# Patient Record
Sex: Female | Born: 1990 | Hispanic: Yes | Marital: Married | Smoking: Never smoker
Health system: Southern US, Community
[De-identification: ages and names within clinical notes are randomized; demographics above are authoritative.]

## PROBLEM LIST (undated history)

## (undated) DIAGNOSIS — Z789 Other specified health status: Secondary | ICD-10-CM

## (undated) HISTORY — PX: NO PAST SURGERIES: SHX2092

---

## 2021-11-27 ENCOUNTER — Other Ambulatory Visit: Payer: Self-pay

## 2021-11-27 ENCOUNTER — Encounter (HOSPITAL_COMMUNITY): Payer: Self-pay | Admitting: Emergency Medicine

## 2021-11-27 ENCOUNTER — Emergency Department (HOSPITAL_COMMUNITY): Payer: Self-pay

## 2021-11-27 ENCOUNTER — Emergency Department (HOSPITAL_COMMUNITY)
Admission: EM | Admit: 2021-11-27 | Discharge: 2021-11-27 | Disposition: A | Discharge status: Home / Self Care | Payer: Self-pay | Attending: Emergency Medicine | Admitting: Emergency Medicine

## 2021-11-27 DIAGNOSIS — N9489 Other specified conditions associated with female genital organs and menstrual cycle: Secondary | ICD-10-CM | POA: Insufficient documentation

## 2021-11-27 DIAGNOSIS — R109 Unspecified abdominal pain: Secondary | ICD-10-CM | POA: Insufficient documentation

## 2021-11-27 DIAGNOSIS — N3000 Acute cystitis without hematuria: Secondary | ICD-10-CM

## 2021-11-27 DIAGNOSIS — R350 Frequency of micturition: Secondary | ICD-10-CM | POA: Insufficient documentation

## 2021-11-27 DIAGNOSIS — R300 Dysuria: Secondary | ICD-10-CM | POA: Insufficient documentation

## 2021-11-27 LAB — CBC WITH DIFFERENTIAL/PLATELET
Abs Immature Granulocytes: 0.02 10*3/uL (ref 0.00–0.07)
Basophils Absolute: 0.1 10*3/uL (ref 0.0–0.1)
Basophils Relative: 1 %
Eosinophils Absolute: 0.1 10*3/uL (ref 0.0–0.5)
Eosinophils Relative: 2 %
HCT: 42 % (ref 36.0–46.0)
Hemoglobin: 14.1 g/dL (ref 12.0–15.0)
Immature Granulocytes: 0 %
Lymphocytes Relative: 40 %
Lymphs Abs: 3.4 10*3/uL (ref 0.7–4.0)
MCH: 29.1 pg (ref 26.0–34.0)
MCHC: 33.6 g/dL (ref 30.0–36.0)
MCV: 86.8 fL (ref 80.0–100.0)
Monocytes Absolute: 0.6 10*3/uL (ref 0.1–1.0)
Monocytes Relative: 7 %
Neutro Abs: 4.3 10*3/uL (ref 1.7–7.7)
Neutrophils Relative %: 50 %
Platelets: 366 10*3/uL (ref 150–400)
RBC: 4.84 MIL/uL (ref 3.87–5.11)
RDW: 12.5 % (ref 11.5–15.5)
WBC: 8.5 10*3/uL (ref 4.0–10.5)
nRBC: 0 % (ref 0.0–0.2)

## 2021-11-27 LAB — URINALYSIS, ROUTINE W REFLEX MICROSCOPIC
Bilirubin Urine: NEGATIVE
Glucose, UA: NEGATIVE mg/dL
Hgb urine dipstick: NEGATIVE
Ketones, ur: NEGATIVE mg/dL
Leukocytes,Ua: NEGATIVE
Nitrite: NEGATIVE
Protein, ur: NEGATIVE mg/dL
Specific Gravity, Urine: 1.018 (ref 1.005–1.030)
pH: 8 (ref 5.0–8.0)

## 2021-11-27 LAB — I-STAT BETA HCG BLOOD, ED (MC, WL, AP ONLY): I-stat hCG, quantitative: <5 m[IU]/mL (ref <5)

## 2021-11-27 MED ORDER — IBUPROFEN 800 MG PO TABS
800.0000 mg | ORAL_TABLET | Freq: Once | ORAL | Status: AC
Start: 2021-11-27 — End: 2021-11-27
  Administered 2021-11-27: 800 mg via ORAL
  Filled 2021-11-27: qty 1

## 2021-11-27 MED ORDER — CEPHALEXIN 250 MG PO CAPS
500.0000 mg | ORAL_CAPSULE | Freq: Once | ORAL | Status: AC
  Administered 2021-11-27: 500 mg via ORAL
  Filled 2021-11-27: qty 2

## 2021-11-27 MED ORDER — CEPHALEXIN 500 MG PO CAPS: 500.0000 mg | ORAL_CAPSULE | Freq: Three times a day (TID) | ORAL | 0 refills | Status: DC

## 2021-11-27 NOTE — ED Provider Triage Note (Signed)
Emergency Medicine Provider Triage Evaluation Note ? ?[place] , a 31 y.o. female  was evaluated in triage.  Pt complains of dysuria, urinary frequency, and back pain x 2 months. Family at bedside interpreting - states that she was seen at a clinic 2 months ago and urine looked somewhat suspicious for a UTI - she was started on abx and her pain somewhat subsided after 1 week on the medication however has never gone away. Pain sometimes radiates to her RLQ however she describes the pain in the "center" of her back.  ? ?Review of Systems  ?Positive: + flank pain, abd pain, dysuria, urinary frequency ?Negative: - nausea, vomiting, fevers ? ?Physical Exam  ?BP 121/61 (BP Location: Left Arm)   Pulse (!) 58   Temp 98.4 ?F (36.9 ?C) (Oral)   Resp 17   LMP 11/23/2021   SpO2 99%  ?Gen:   Awake, no distress   ?Resp:  Normal effort  ?MSK:   Moves extremities without difficulty  ?Other:   ? ?Medical Decision Making  ?Medically screening exam initiated at 12:51 PM.  Appropriate orders placed.  [place] Throckmorton was informed that the remainder of the evaluation will be completed by another provider, this initial triage assessment does not replace that evaluation, and the importance of remaining in the ED until their evaluation is complete. ? ? ?  ?Tanda Rockers, PA-C ?11/27/21 1252 ? ?

## 2021-11-27 NOTE — ED Provider Notes (Signed)
?[facility] EMERGENCY DEPARTMENT ?Provider Note ? ? ?CSN: [id number] ?Arrival date & time: 11/27/21  1115 ? ?  ? ?History ? ?Chief Complaint  ?Patient presents with  ? Back Pain  ? Dysuria  ? ? ?Robin Shelton is a 31 y.o. female here presenting with bilateral flank pain and dysuria.  Patient has been having ongoing symptoms for 2 months.  She states that she finished a course antibiotics and briefly felt better.  She states that for the last 3 to 4 days, she has worsening flank pain.  She states that she has worsening dysuria and frequency as well.  Denies any fevers or chills or vomiting.  Denies any trauma or injury ? ?The history is provided by the patient.  ? ?  ? ?Home Medications ?Prior to Admission medications   ?Not on File  ?   ? ?Allergies    ?Patient has no allergy information on record.   ? ?Review of Systems   ?Review of Systems  ?Genitourinary:  Positive for dysuria.  ?Musculoskeletal:  Positive for back pain.  ?All other systems reviewed and are negative. ? ?Physical Exam ?Updated Vital Signs ?BP 128/69 (BP Location: Left Arm)   Pulse 79   Temp 98.4 ?F (36.9 ?C) (Oral)   Resp 19   LMP 11/23/2021   SpO2 100%  ?Physical Exam ?Vitals and nursing note reviewed.  ?Constitutional:   ?   Appearance: Normal appearance.  ?HENT:  ?   Head: Normocephalic.  ?   Nose: Nose normal.  ?   Mouth/Throat:  ?   Mouth: Mucous membranes are moist.  ?Eyes:  ?   Pupils: Pupils are equal, round, and reactive to light.  ?Cardiovascular:  ?   Rate and Rhythm: Normal rate and regular rhythm.  ?   Pulses: Normal pulses.  ?   Heart sounds: Normal heart sounds.  ?Pulmonary:  ?   Effort: Pulmonary effort is normal.  ?   Breath sounds: Normal breath sounds.  ?Abdominal:  ?   Comments: Mild bilateral CVA tenderness versus paralumbar tenderness.  No abdominal tenderness  ?Musculoskeletal:  ?   Cervical back: Normal range of motion and neck supple.  ?Skin: ?   General: Skin is warm.  ?   Capillary Refill: Capillary  refill takes less than 2 seconds.  ?Neurological:  ?   General: No focal deficit present.  ?   Mental Status: She is alert and oriented to person, place, and time.  ?Psychiatric:     ?   Mood and Affect: Mood normal.     ?   Behavior: Behavior normal.  ? ? ?ED Results / Procedures / Treatments   ?Labs ?(all labs ordered are listed, but only abnormal results are displayed) ?Labs Reviewed  ?URINALYSIS, ROUTINE W REFLEX MICROSCOPIC - Abnormal; Notable for the following components:  ?    Result Value  ? APPearance TURBID (*)   ? Bacteria, UA RARE (*)   ? All other components within normal limits  ?CBC WITH DIFFERENTIAL/PLATELET  ?BASIC METABOLIC PANEL  ?I-STAT BETA HCG BLOOD, ED ([facility], [facility], [facility] ONLY)  ? ? ?EKG ?None ? ?Radiology ?CT Renal Stone Study ? ?Result Date: 11/27/2021 ?CLINICAL DATA:  Back pain, dysuria, urinary frequency EXAM: CT ABDOMEN AND PELVIS WITHOUT CONTRAST TECHNIQUE: Multidetector CT imaging of the abdomen and pelvis was performed following the standard protocol without IV contrast. RADIATION DOSE REDUCTION: This exam was performed according to the departmental dose-optimization program which includes automated exposure control, adjustment of  the mA and/or kV according to patient size and/or use of iterative reconstruction technique. COMPARISON:  None. FINDINGS: Lower chest: No acute abnormality. Hepatobiliary: No focal liver abnormality is seen. No gallstones, gallbladder wall thickening, or biliary dilatation. Pancreas: Unremarkable. No pancreatic ductal dilatation or surrounding inflammatory changes. Spleen: Normal in size without focal abnormality. Adrenals/Urinary Tract: The adrenal glands are unremarkable. Normal noncontrast appearance of the bilateral kidneys, without hydronephrosis or nephrolithiasis. No stone is seen in the course of the ureters, although the inferior ureters are poorly evaluated. No stone is seen in the bladder, which is unremarkable for degree of distension. Stomach/Bowel:  Stomach is within normal limits. Appendix appears normal. No evidence of bowel wall thickening, distention, or inflammatory changes. Vascular/Lymphatic: No significant vascular findings are present. No enlarged abdominal or pelvic lymph nodes. Reproductive: Uterus and bilateral adnexa are unremarkable. Other: No abdominal wall hernia or abnormality. No abdominopelvic ascites. Musculoskeletal: No acute or significant osseous findings. IMPRESSION: 1. Normal noncontrast appearance of the bilateral kidneys, without radiopaque calculus seen in the kidneys, ureters, or bladder. 2. No acute process in the abdomen or pelvis. Electronically Signed   By: Wiliam Ke M.D.   On: 11/27/2021 14:50   ? ?Procedures ?Procedures  ? ? ?Medications Ordered in ED ?Medications  ?cephALEXin (KEFLEX) capsule 500 mg (500 mg Oral Given 11/27/21 2218)  ?ibuprofen (ADVIL) tablet 800 mg (800 mg Oral Given 11/27/21 2218)  ? ? ?ED Course/ Medical Decision Making/ A&P ?  ?                        ?Medical Decision Making ?Robin Shelton is a 32 y.o. female here with dysuria and bilateral flank pain.  Consider pyelonephritis versus renal colic versus musculoskeletal back pain.  We will get CBC and CMP and UA and CT abdomen pelvis ? ?10:33 PM ?Lab work unremarkable.  UA showed some bacteria.  CT showed no obvious pyelonephritis.  I think she likely has UTI.  Since she is symptomatic we will give a course of antibiotic ?  ? ?Problems Addressed: ?Acute cystitis without hematuria: acute illness or injury ? ?Amount and/or Complexity of Data Reviewed ?Labs: ordered. Decision-making details documented in ED Course. ?Radiology: ordered and independent interpretation performed. Decision-making details documented in ED Course. ? ?Risk ?Prescription drug management. ? ? ?ision making why or why not admission, treatments were needed:1} ?Final Clinical Impression(s) / ED Diagnoses ?Final diagnoses:  ?None  ? ? ?Rx / DC Orders ?ED Discharge Orders   ? ? None  ? ?   ? ? ?  ?Charlynne Pander, MD ?11/27/21 2234 ? ?

## 2021-11-27 NOTE — Discharge Instructions (Signed)
You likely have a urinary tract infection ? ?Take Keflex 3 times daily for a week ? ?Take Motrin 600 mg every 6 hours for pain ? ?See your doctor for follow-up ? ?Return to ER if you have worse abdominal pain, flank pain, trouble urinating, fever ?

## 2021-11-27 NOTE — ED Triage Notes (Signed)
Pt reports lower back pain, cramping, dysuria, and foul smelling urine x 2 months. ?

## 2022-05-03 LAB — OB RESULTS CONSOLE RPR
RPR: NONREACTIVE
RPR: NONREACTIVE

## 2022-05-03 LAB — OB RESULTS CONSOLE GC/CHLAMYDIA
Chlamydia: NEGATIVE
Chlamydia: NEGATIVE
Neisseria Gonorrhea: NEGATIVE
Neisseria Gonorrhea: NEGATIVE

## 2022-05-03 LAB — OB RESULTS CONSOLE HEPATITIS B SURFACE ANTIGEN
Hepatitis B Surface Ag: NEGATIVE
Hepatitis B Surface Ag: NEGATIVE

## 2022-05-03 LAB — HEPATITIS C ANTIBODY
HCV Ab: NEGATIVE
HCV Ab: NEGATIVE

## 2022-05-03 LAB — OB RESULTS CONSOLE HIV ANTIBODY (ROUTINE TESTING)
HIV: NONREACTIVE
HIV: NONREACTIVE

## 2022-05-03 LAB — OB RESULTS CONSOLE ABO/RH: RH Type: POSITIVE

## 2022-05-03 LAB — OB RESULTS CONSOLE RUBELLA ANTIBODY, IGM
Rubella: IMMUNE
Rubella: IMMUNE

## 2022-08-13 NOTE — L&D Delivery Note (Cosign Needed)
OB/GYN Faculty Practice Delivery Note  Robin Shelton is a 32 y.o. G1P0 s/p VAVD at [redacted]w[redacted]d She was admitted for IOL for postdates.   ROM: 32h 561mith clear fluid GBS Status:  Positive/-- (01/26 0000) Maximum Maternal Temperature:  Temp (24hrs), Avg:99 F (37.2 C), Min:97.7 F (36.5 C), Max:100.2 F (37.9 C)    Labor Progress: Patient arrived at 0 cm dilation and was induced with cytotec, FB, AROM, Pit.   Delivery Date/Time: 10/10/2022 at 0124  Operative Delivery Note Infant was delivered via Vacuum Assisted Vaginal Delivery due to NRFHT.  The patient was examined and found to be Presentation: vertex; Position: Left,, Occiput,, Anterior; Station: +3.  Verbal consent: obtained from patient.  Risks and benefits discussed in detail.  Risks include, but are not limited to the risks of anesthesia, bleeding, infection, damage to maternal tissues, fetal cephalhematoma.  There is also the risk of inability to effect vaginal delivery of the head, or shoulder dystocia that cannot be resolved by established maneuvers, leading to the need for emergency cesarean section.  The kiwi was positioned over the sagittal suture 3 cm anterior to posterior fontanelle.  Pressure was then increased to 500 mmHg, and the patient was instructed to push.  Pulling was administered along the pelvic curve.  2 pulls were administered during 1 contraction.  1 popoffs. Head delivered in LOA position. Nuchal cord present and reduced immediately. Shoulder and body delivered in usual fashion. Infant with spontaneous cry, placed on mother's abdomen, dried and stimulated. Cord clamped x 2 after 1-minute delay, and cut by FOB. Cord blood drawn. Placenta delivered spontaneously with gentle cord traction. Fundus firm with massage and Pitocin. Labia, perineum, vagina, and cervix inspected with bilateral labial lacerations repaired and 1st degree perineal laceration.    Sponge, instrument and needle counts were correct x2.  Placenta:  spontaneous, intact, 3 vessel cord Complications: None Lacerations: 1st degree perineal and bilateral labial  EBL: 211 mL Analgesia: epidural    Infant: APGAR (1 MIN): 8   APGAR (5 MINS): 9   APGAR (10 MINS):    Weight: pending  ViGifford ShaveMD  OB Fellow  10/10/2022 2:14 AM

## 2022-09-07 LAB — OB RESULTS CONSOLE GBS
GBS: POSITIVE
GBS: POSITIVE

## 2022-10-06 ENCOUNTER — Other Ambulatory Visit: Payer: Self-pay | Admitting: Advanced Practice Midwife

## 2022-10-08 ENCOUNTER — Inpatient Hospital Stay (HOSPITAL_COMMUNITY): Payer: Medicaid Other

## 2022-10-08 ENCOUNTER — Encounter (HOSPITAL_COMMUNITY): Payer: Self-pay | Admitting: Obstetrics and Gynecology

## 2022-10-08 ENCOUNTER — Inpatient Hospital Stay (HOSPITAL_COMMUNITY)
Admission: AD | Admit: 2022-10-08 | Discharge: 2022-10-11 | DRG: 807 | Disposition: A | Discharge status: Home / Self Care | Payer: Medicaid Other | Attending: Family Medicine | Admitting: Family Medicine

## 2022-10-08 ENCOUNTER — Other Ambulatory Visit: Payer: Self-pay

## 2022-10-08 DIAGNOSIS — O99824 Streptococcus B carrier state complicating childbirth: Secondary | ICD-10-CM | POA: Diagnosis present

## 2022-10-08 DIAGNOSIS — Z3A40 40 weeks gestation of pregnancy: Secondary | ICD-10-CM

## 2022-10-08 DIAGNOSIS — O480 Post-term pregnancy: Secondary | ICD-10-CM | POA: Diagnosis present

## 2022-10-08 DIAGNOSIS — Z3490 Encounter for supervision of normal pregnancy, unspecified, unspecified trimester: Principal | ICD-10-CM | POA: Diagnosis present

## 2022-10-08 DIAGNOSIS — O99820 Streptococcus B carrier state complicating pregnancy: Secondary | ICD-10-CM | POA: Diagnosis not present

## 2022-10-08 DIAGNOSIS — Z3A41 41 weeks gestation of pregnancy: Secondary | ICD-10-CM

## 2022-10-08 HISTORY — DX: Other specified health status: Z78.9

## 2022-10-08 LAB — CBC
HCT: 40.6 % (ref 36.0–46.0)
Hemoglobin: 14.2 g/dL (ref 12.0–15.0)
MCH: 31.4 pg (ref 26.0–34.0)
MCHC: 35 g/dL (ref 30.0–36.0)
MCV: 89.8 fL (ref 80.0–100.0)
Platelets: 307 10*3/uL (ref 150–400)
RBC: 4.52 MIL/uL (ref 3.87–5.11)
RDW: 12.9 % (ref 11.5–15.5)
WBC: 11.4 10*3/uL — ABNORMAL HIGH (ref 4.0–10.5)
nRBC: 0 % (ref 0.0–0.2)

## 2022-10-08 LAB — TYPE AND SCREEN
ABO/RH(D): A POS
Antibody Screen: NEGATIVE

## 2022-10-08 LAB — OB RESULTS CONSOLE RPR: RPR: NONREACTIVE

## 2022-10-08 LAB — RPR: RPR Ser Ql: NONREACTIVE

## 2022-10-08 MED ORDER — FENTANYL CITRATE (PF) 100 MCG/2ML IJ SOLN
50.0000 ug | INTRAMUSCULAR | Status: DC | PRN
  Administered 2022-10-09 (×2): 100 ug via INTRAVENOUS
  Administered 2022-10-09: 50 ug via INTRAVENOUS
  Filled 2022-10-08 (×3): qty 2

## 2022-10-08 MED ORDER — LIDOCAINE HCL (PF) 1 % IJ SOLN: 30.0000 mL | INTRAMUSCULAR | Status: DC | PRN

## 2022-10-08 MED ORDER — LACTATED RINGERS IV SOLN
500.0000 mL | INTRAVENOUS | Status: DC | PRN
  Administered 2022-10-09: 500 mL via INTRAVENOUS

## 2022-10-08 MED ORDER — MISOPROSTOL 25 MCG QUARTER TABLET
25.0000 ug | ORAL_TABLET | ORAL | Status: DC | PRN
  Administered 2022-10-08: 25 ug via VAGINAL
  Filled 2022-10-08: qty 1

## 2022-10-08 MED ORDER — OXYTOCIN-SODIUM CHLORIDE 30-0.9 UT/500ML-% IV SOLN
1.0000 m[IU]/min | INTRAVENOUS | Status: DC
  Administered 2022-10-08 – 2022-10-09 (×2): 2 m[IU]/min via INTRAVENOUS
  Filled 2022-10-08: qty 500

## 2022-10-08 MED ORDER — PENICILLIN G POT IN DEXTROSE 60000 UNIT/ML IV SOLN
3.0000 10*6.[IU] | INTRAVENOUS | Status: DC
  Administered 2022-10-08 – 2022-10-09 (×9): 3 10*6.[IU] via INTRAVENOUS
  Filled 2022-10-08 (×9): qty 50

## 2022-10-08 MED ORDER — OXYTOCIN-SODIUM CHLORIDE 30-0.9 UT/500ML-% IV SOLN
2.5000 [IU]/h | INTRAVENOUS | Status: DC
  Administered 2022-10-10: 2.5 [IU]/h via INTRAVENOUS
  Filled 2022-10-08 (×2): qty 500

## 2022-10-08 MED ORDER — SOD CITRATE-CITRIC ACID 500-334 MG/5ML PO SOLN: 30.0000 mL | ORAL | Status: DC | PRN

## 2022-10-08 MED ORDER — TERBUTALINE SULFATE 1 MG/ML IJ SOLN: 0.2500 mg | Freq: Once | INTRAMUSCULAR | Status: DC | PRN

## 2022-10-08 MED ORDER — OXYCODONE-ACETAMINOPHEN 5-325 MG PO TABS: 1.0000 | ORAL_TABLET | ORAL | Status: DC | PRN

## 2022-10-08 MED ORDER — ACETAMINOPHEN 325 MG PO TABS
650.0000 mg | ORAL_TABLET | ORAL | Status: DC | PRN
  Administered 2022-10-10: 650 mg via ORAL
  Filled 2022-10-08: qty 2

## 2022-10-08 MED ORDER — MISOPROSTOL 25 MCG QUARTER TABLET
25.0000 ug | ORAL_TABLET | Freq: Once | ORAL | Status: AC
  Administered 2022-10-08: 25 ug via VAGINAL
  Filled 2022-10-08: qty 1

## 2022-10-08 MED ORDER — SODIUM CHLORIDE 0.9 % IV SOLN
5.0000 10*6.[IU] | Freq: Once | INTRAVENOUS | Status: AC
  Administered 2022-10-08: 5 10*6.[IU] via INTRAVENOUS
  Filled 2022-10-08: qty 5

## 2022-10-08 MED ORDER — LACTATED RINGERS IV SOLN: INTRAVENOUS | Status: DC

## 2022-10-08 MED ORDER — MISOPROSTOL 50MCG HALF TABLET
50.0000 ug | ORAL_TABLET | Freq: Once | ORAL | Status: AC
  Administered 2022-10-08: 50 ug via ORAL
  Filled 2022-10-08: qty 1

## 2022-10-08 MED ORDER — ONDANSETRON HCL 4 MG/2ML IJ SOLN: 4.0000 mg | Freq: Four times a day (QID) | INTRAMUSCULAR | Status: DC | PRN

## 2022-10-08 MED ORDER — FLEET ENEMA 7-19 GM/118ML RE ENEM: 1.0000 | ENEMA | RECTAL | Status: DC | PRN

## 2022-10-08 MED ORDER — OXYTOCIN BOLUS FROM INFUSION
333.0000 mL | Freq: Once | INTRAVENOUS | Status: AC
  Administered 2022-10-10: 333 mL via INTRAVENOUS

## 2022-10-08 MED ORDER — OXYCODONE-ACETAMINOPHEN 5-325 MG PO TABS: 2.0000 | ORAL_TABLET | ORAL | Status: DC | PRN

## 2022-10-08 NOTE — Progress Notes (Addendum)
Labor Progress Note Robin Shelton is a 32 y.o. G1P0 at 63w6dpresented for IOL for PD  S:  Feeling ctx, declines pain meds.  O:  BP 127/77   Pulse (!) 58   Temp 98.6 F (37 C)   Resp 16   Ht '4\' 11"'$  (1.499 m)   Wt 67.7 kg   LMP 11/25/2021 (Approximate)   SpO2 99%   BMI 30.15 kg/m  EFM: baseline 135 bpm/ mod variability/ + accels/ no decels  Toco/IUPC: 2-4 SVE: Dilation: 4.5 Effacement (%): 90 Cervical Position: Posterior Station: -1 Presentation: Vertex Exam by:: MJulianne Handler CNM Pitocin: 14 mu/min  A/P: 32y.o. G1P0 476w6d1. Labor: latent 2. FWB: Cat I 3. Pain: analgesia/anesthesia/NO prn 4. GBS pos>PCN  S/p AROM-clear fluid around 1600. Consented for IUPC, placed w/o difficulty. Titrate Pitocin for adequate labor. Anticipate SVD.  MeJulianne HandlerCNM 9:14 PM

## 2022-10-08 NOTE — Progress Notes (Signed)
Labor Progress Note Robin Shelton is a 32 y.o. G1P0 at 11w6dpresented for IOL for post-dates. S: Patient reports that her contractions are spaced out and not very painful. She did have R arm pain when the PCN was running, denies rash or difficulty breathing. She denies leakage of fluid.   O:  BP 111/65   Pulse 75   Temp 98.1 F (36.7 C)   Resp 16   Ht '4\' 11"'$  (1.499 m)   Wt 67.7 kg   LMP 11/25/2021 (Approximate)   SpO2 99%   BMI 30.15 kg/m  EFM: 150/moderate/accelerations present/variable decelerations  CVE: Dilation: 1 Effacement (%): 50 Cervical Position: Posterior Station: -3 Exam by:: A. Hairston,RN   A&P: 32y.o. G1P0 415w6dor IOL for post-dates. #Labor: S/p 2 doses cytotec. Will consider FB placement at next check.  #Pain: IV pain medications and nitrous oxide, not planning on epidural but may change her mind #FWB: Cat II #GBS positive - s/p 2 doses PCN, pain with most recent PCN infusion but no evidence of allergy/anaphylaxis   LyDarlen RoundMD PGY-1 10:57 AM

## 2022-10-08 NOTE — Progress Notes (Addendum)
Labor Progress Note [name] is a 32 y.o. G1P0 at 26w6dpresented for  IOL for post-dates.  S: Patient comfortable with contractions.   O:  BP 111/65   Pulse 75   Temp 98.1 F (36.7 C)   Resp 16   Ht '4\' 11"'$  (1.499 m)   Wt 67.7 kg   LMP 11/25/2021 (Approximate)   SpO2 99%   BMI 30.15 kg/m  EFM: 135/moderate/accelerations present, decels absent  CVE: Dilation: 3 Effacement (%): 50 Cervical Position: Posterior Station: -2 Presentation: Vertex Exam by:: Dr. AJanus Molder  A&P: 32y.o. G1P0 420w6dIOL for post-dates. #Labor: Progressing well. S/p 3 doses cytotec. Had previously discussed FB but given change on cervical exam, will defer. Contracting often on the monitor; will give additional dose of cytotec vs start pitocin if contractions space out. Can consider AROM at next check. #Pain: IV pain medications and nitrous oxide  #FWB: Cat I #GBS positive s/p 2 doses PCN  LyDarlen RoundMD PGY-1 12:13 PM

## 2022-10-08 NOTE — Progress Notes (Signed)
Labor Progress Note Robin Shelton is a 32 y.o. G1P0 at 67w6dpresented for IOL for post-dates.  S: Patient reports feeling weak contractions. No gush of fluids.  O:  BP 118/71   Pulse 68   Temp 98.1 F (36.7 C) (Oral)   Resp 16   Ht '4\' 11"'$  (1.499 m)   Wt 67.7 kg   LMP 11/25/2021 (Approximate)   SpO2 99%   BMI 30.15 kg/m  EFM: 135/moderate/accelerations present, no decels  CVE: Dilation: 4 Effacement (%): 60 Cervical Position: Posterior Station: -3 Presentation: Vertex Exam by:: Dr. ALoni Muse  A&P: 32y.o. G1P0 422w6dOL for post-dates.  #Labor: Latent labor. S/p 2 doses cytotec. Pitocin started 1420. AROM at 1628 with clear fluid. Will uptitrate pitocin. #Pain: IV pain medications and nitrous oxide   #FWB: Cat I #GBS positive s/p 3 doses PCN  LyDarlen RoundMD PGY-1 4:49 PM

## 2022-10-08 NOTE — H&P (Addendum)
OBSTETRIC ADMISSION HISTORY AND PHYSICAL  Robin Shelton is a 32 y.o. female G1P0 with IUP at 78w6dby [place] for IOL for post-dates. She reports +FMs, No LOF, no VB, no blurry vision, headaches or peripheral edema, and RUQ pain.  She plans on breast and formula feeding. She request depo provera for birth control. [facility] [place]  Estimated Date of Delivery: 10/02/22  Sono:    '@[redacted]w[redacted]d'$ , CWD, normal anatomy, posterior placenta, 31% EFW   Prenatal History/Complications: None  Past Medical History: No past medical history on file.  Past Surgical History: No past surgical history on file.  Obstetrical History: OB History     Gravida  1   Para      Term      Preterm      AB      Living         SAB      IAB      Ectopic      Multiple      Live Births              Social History Social History   Socioeconomic History   Marital status: Married    Spouse name: Not on file   Number of children: Not on file   Years of education: Not on file   Highest education level: Not on file  Occupational History   Not on file  Tobacco Use   Smoking status: Never   Smokeless tobacco: Never  Substance and Sexual Activity   Alcohol use: Yes   Drug use: Not Currently   Sexual activity: Not on file  Other Topics Concern   Not on file  Social History Narrative   Not on file   Social Determinants of Health   Financial Resource Strain: Not on file  Food Insecurity: Not on file  Transportation Needs: Not on file  Physical Activity: Not on file  Stress: Not on file  Social Connections: Not on file    Family History: No family history on file.  Allergies: Not on File  Medications Prior to Admission  Medication Sig Dispense Refill Last Dose   cephALEXin (KEFLEX) 500 MG capsule Take 1 capsule (500 mg total) by mouth 3 (three) times daily. 21 capsule 0      Review of Systems   All systems reviewed and  negative except as stated in HPI  Last menstrual period 11/25/2021. General appearance: alert, cooperative, appears stated age, and no distress Lungs: clear to auscultation bilaterally Heart: regular rate and rhythm Abdomen: soft, non-tender; bowel sounds normal Pelvic: performed by another provider Extremities: no sign of DVT Presentation: cephalic Fetal monitoringBaseline: 140 bpm, Variability: Good {> 6 bpm), Accelerations: Reactive, and Decelerations: Absent Uterine activityFrequency: Every 4 minutes     Prenatal labs: ABO, Rh: --/--/PENDING (02/26 0025) Antibody: PENDING (02/26 0025) Rubella:  immune RPR:   nonreactive HBsAg:   nonreactive HIV:   nonreactive GBS:   positive 1 hr Glucola abnormal. 3hr GTT normal.  Genetic screening  NIPs (results not on file) Anatomy [place]  Prenatal Transfer Tool  Maternal Diabetes: No Genetic Screening: NIPS (results not on file) Maternal Ultrasounds/Referrals: Normal Fetal Ultrasounds or other Referrals:  None Maternal Substance Abuse:  No Significant Maternal Medications:  None Significant Maternal Lab Results:  Group B Strep positive Number of Prenatal Visits:greater than 3 verified prenatal visits Other Comments:  None  Results for orders placed or performed during  the hospital encounter of 10/08/22 (from the past 24 hour(s))  Type and screen [place]   Collection Time: 10/08/22 12:25 AM  Result Value Ref Range   ABO/RH(D) PENDING    Antibody Screen PENDING    Sample Expiration      10/11/2022,2359 Performed at [facility], [place] [address], [place], [place] [address]     Patient Active Problem List   Diagnosis Date Noted   Encounter for induction of labor 10/08/2022    Assessment/Plan:  Robin Shelton is a 32 y.o. G1P0 at 59w6dhere for IOL for post-dates.   #Labor: Plan to give dual cytotec now. Consider FB, pit, and AROM as indicated. #Pain: IV pain medications and nitrous  oxide #FWB: Cat 1 #ID:  GBS positive. Plan to start penicillin for prophylaxis.  #MOF: both breast and formula #MOC:depo provera #Circ:  N/A, female baby  JArlyce Dice MD  10/08/2022, 12:47 AM  Fellow Attestation  I saw and evaluated the patient, performing the key elements of the service.I  personally performed or re-performed the history, physical exam, and medical decision making activities of this service and have verified that the service and findings are accurately documented in the resident's note. I developed the management plan that is described in the resident's note, and I agree with the content, with my edits above.   32y.o. G1P0 456w6dere for IOL for postdates. Healthy pregnancy and patient. GBS +, will get PCN. Discussed process for IOL at length, via IpRussellvillenterpreter. Answered any questions. Bedside [place] to confirm cephalic presentation.  ChStormy CardMD,MP[facility] OB Fellow, Faculty Practice  10/08/2022 1:48 AM

## 2022-10-09 ENCOUNTER — Inpatient Hospital Stay (HOSPITAL_COMMUNITY): Payer: Medicaid Other | Admitting: Anesthesiology

## 2022-10-09 MED ORDER — PHENYLEPHRINE 80 MCG/ML (10ML) SYRINGE FOR IV PUSH (FOR BLOOD PRESSURE SUPPORT)
80.0000 ug | PREFILLED_SYRINGE | INTRAVENOUS | Status: DC | PRN
  Filled 2022-10-09: qty 10

## 2022-10-09 MED ORDER — EPHEDRINE 5 MG/ML INJ: 10.0000 mg | INTRAVENOUS | Status: DC | PRN

## 2022-10-09 MED ORDER — DIPHENHYDRAMINE HCL 50 MG/ML IJ SOLN: 12.5000 mg | INTRAMUSCULAR | Status: DC | PRN

## 2022-10-09 MED ORDER — FENTANYL-BUPIVACAINE-NACL 0.5-0.125-0.9 MG/250ML-% EP SOLN
EPIDURAL | Status: DC | PRN
  Administered 2022-10-09: 12 mL/h via EPIDURAL

## 2022-10-09 MED ORDER — PHENYLEPHRINE 80 MCG/ML (10ML) SYRINGE FOR IV PUSH (FOR BLOOD PRESSURE SUPPORT)
80.0000 ug | PREFILLED_SYRINGE | INTRAVENOUS | Status: DC | PRN
  Administered 2022-10-09 (×2): 80 ug via INTRAVENOUS

## 2022-10-09 MED ORDER — LIDOCAINE HCL (PF) 1 % IJ SOLN
INTRAMUSCULAR | Status: DC | PRN
  Administered 2022-10-09: 10 mL via EPIDURAL
  Administered 2022-10-09: 2 mL via EPIDURAL

## 2022-10-09 MED ORDER — LACTATED RINGERS IV SOLN
500.0000 mL | Freq: Once | INTRAVENOUS | Status: AC
  Administered 2022-10-09: 500 mL via INTRAVENOUS

## 2022-10-09 MED ORDER — FENTANYL-BUPIVACAINE-NACL 0.5-0.125-0.9 MG/250ML-% EP SOLN
12.0000 mL/h | EPIDURAL | Status: DC | PRN
  Administered 2022-10-09: 12 mL/h via EPIDURAL
  Filled 2022-10-09 (×2): qty 250

## 2022-10-09 MED ORDER — CALCIUM CARBONATE ANTACID 500 MG PO CHEW
400.0000 mg | CHEWABLE_TABLET | Freq: Once | ORAL | Status: AC
  Administered 2022-10-09: 400 mg via ORAL
  Filled 2022-10-09: qty 2

## 2022-10-09 NOTE — Progress Notes (Signed)
Labor Progress Note Robin Shelton is a 32 y.o. G1P0 at 29w0dpresented for  IOL for PD   S: Patient resting comfortably in bed. Reports pressure sensation but no pain with contractions  O:  BP (!) 100/51   Pulse 72   Temp 98.5 F (36.9 C) (Axillary)   Resp 16   Ht '4\' 11"'$  (1.499 m)   Wt 67.7 kg   LMP 11/25/2021 (Approximate)   SpO2 98%   BMI 30.15 kg/m  EFM: 125/moderate/accelerations present, decels absent  CVE: Dilation: 8 Effacement (%): (P) 90 Cervical Position: Posterior Station: (P) -1 Presentation: Vertex Exam by:: (P) Dr. RDenman George  A&P: 32y.o. G1P0 445w0dOL for PD   #Labor: Progressing slowly. Pitocin restarted [id number] at 1891mr. Contractions not adequate. Will continue to uptitrate pitocin to max 59m28m.  #Pain: Epidural #FWB: Cat I #GBS positive  [name] PGY-1 5:25 PM

## 2022-10-09 NOTE — Progress Notes (Signed)
Labor Progress Note Robin Shelton is a 32 y.o. G1P0 at 45w0dpresented for IOL for PD  S: Patient tearful. She is worried about how long the induction is taking.  O:  BP (!) 97/55   Pulse 76   Temp 99.2 F (37.3 C) (Axillary)   Resp 16   Ht '4\' 11"'$  (1.499 m)   Wt 67.7 kg   LMP 11/25/2021 (Approximate)   SpO2 98%   BMI 30.15 kg/m  EFM: 130/moderate/accelerations present, no decelerations  CVE: Dilation: 7 Effacement (%): 90 Cervical Position: Posterior Station: -1 Presentation: Vertex Exam by:: AMingo Amber RN   A&P: 32y.o. G1P0 471w0dresented for IOL for PD  #Labor: S/p 4 hour pitocin break. Pitocin restarted 1255, will uptitrate to achieve adequate contractions. Next CVE 1700 #Pain: Epidural #FWB: Cat I #GBS positive s/p 7 dose PCN  LyDarlen RoundMD PGY-1 1:42 PM

## 2022-10-09 NOTE — Progress Notes (Signed)
Labor Progress Note Daylynn Bergstrand is a 32 y.o. G1P0 at 45w0dpresented for  IOL for PD   S: Patient feeling more lower abdominal pressure.   O:  BP 102/63   Pulse 74   Temp 99.5 F (37.5 C) (Axillary)   Resp 16   Ht '4\' 11"'$  (1.499 m)   Wt 67.7 kg   LMP 11/25/2021 (Approximate)   SpO2 97%   BMI 30.15 kg/m  EFM: 130 /moderate/accelerations present, decels absent  CVE: Dilation: Lip/rim Effacement (%): 90 Cervical Position: Posterior Station: -1 Presentation: Vertex Exam by:: VGifford Shave MD   A&P: 32y.o. [id number] for PD   #Labor: Slow progression. Making change. Not having adequate contractions. Will continue to titrate pitocin and plan on recheck in 1 hour and may start pushing at that time\.  #Pain: Epidural #FWB: Cat I #GBS positive  JoConcepcion LivingMD PGY-1 9:11 PM

## 2022-10-09 NOTE — Progress Notes (Signed)
Labor Progress Note Robin Shelton is a 32 y.o. G1P0 at 19w0dpresented for IOL for PD  S:  Resting with epidural.   O:  BP (!) 104/51   Pulse 65   Temp 98 F (36.7 C) (Oral)   Resp 16   Ht '4\' 11"'$  (1.499 m)   Wt 67.7 kg   LMP 11/25/2021 (Approximate)   SpO2 99%   BMI 30.15 kg/m  EFM: baseline 130 bpm/ mod variability/ + accels/ no decels  IUPC/MVU: 1-4/120 SVE: Dilation: 6 Effacement (%): 90 Cervical Position: Posterior Station: -1 Presentation: Vertex Exam by:: SArneta Cliche RN Pitocin: 30 mu/min  A/P: 32y.o. G1P0 436w0d1. Labor: active, protracted 2. FWB: Cat I 3. Pain: epidural   Minimal progression and maxed on Pit dosage. Consider Pitocin break. Anticipate SVD.  MeJulianne HandlerCNM 7:10 AM

## 2022-10-09 NOTE — Anesthesia Preprocedure Evaluation (Signed)
Anesthesia Evaluation  Patient identified by MRN, date of birth, ID band Patient awake    Reviewed: Allergy & Precautions, Patient's Chart, lab work & pertinent test results  Airway Mallampati: II  TM Distance: >3 FB Neck ROM: Full    Dental no notable dental hx.    Pulmonary neg pulmonary ROS   Pulmonary exam normal breath sounds clear to auscultation       Cardiovascular negative cardio ROS Normal cardiovascular exam Rhythm:Regular Rate:Normal     Neuro/Psych negative neurological ROS  negative psych ROS   GI/Hepatic negative GI ROS, Neg liver ROS,,,  Endo/Other  negative endocrine ROS    Renal/GU negative Renal ROS  negative genitourinary   Musculoskeletal negative musculoskeletal ROS (+)    Abdominal   Peds negative pediatric ROS (+)  Hematology negative hematology ROS (+) Hb 14.2, plt 307   Anesthesia Other Findings   Reproductive/Obstetrics (+) Pregnancy                             Anesthesia Physical Anesthesia Plan  ASA: 2  Anesthesia Plan: Epidural   Post-op Pain Management:    Induction:   PONV Risk Score and Plan: 2  Airway Management Planned: Natural Airway  Additional Equipment: None  Intra-op Plan:   Post-operative Plan:   Informed Consent: I have reviewed the patients History and Physical, chart, labs and discussed the procedure including the risks, benefits and alternatives for the proposed anesthesia with the patient or authorized representative who has indicated his/her understanding and acceptance.     Interpreter used for [employer] Discussed with:   Anesthesia Plan Comments:        Anesthesia Quick Evaluation

## 2022-10-09 NOTE — Progress Notes (Signed)
MVUs not adequte. Initiate pitocin break for full 4 hours. Tums given. Restart new bag @ 1PM. Cat I tracing  Gerlene Fee, DO OB Fellow, [place] for [place] 10/09/2022, 10:01 AM

## 2022-10-09 NOTE — Progress Notes (Signed)
Labor Progress Note Robin Shelton is a 32 y.o. G1P0 at 6w0dpresented for IOL for PD  S:  Feeling more regular ctx. Had recent dose of IV pain meds that helped.   O:  BP 112/61   Pulse 68   Temp 97.9 F (36.6 C) (Oral)   Resp 16   Ht '4\' 11"'$  (1.499 m)   Wt 67.7 kg   LMP 11/25/2021 (Approximate)   SpO2 99%   BMI 30.15 kg/m  EFM: baseline 125 bpm/ mod variability/ no accels/ no decels  IUPC/MVU: q1.5-3/170 SVE: Dilation: 5 Effacement (%): 90 Cervical Position: Posterior Station: -1 Presentation: Vertex Exam by:: SArneta Cliche RN Pitocin: 26 mu/min  A/P: 32y.o. G1P0 425w0d1. Labor: latent 2. FWB: Cat I 3. Pain: analgesia/anesthesia/NO prn   Continue Pitocin titration. Anticipate labor progress and SVD.  MeJulianne HandlerCNM 2:27 AM

## 2022-10-09 NOTE — Anesthesia Procedure Notes (Signed)
Epidural Patient location during procedure: OB Start time: 10/09/2022 5:09 AM End time: 10/09/2022 5:21 AM  Staffing Anesthesiologist: Pervis Hocking, DO Performed: anesthesiologist   Preanesthetic Checklist Completed: patient identified, IV checked, risks and benefits discussed, monitors and equipment checked, pre-op evaluation and timeout performed  Epidural Patient position: sitting Prep: DuraPrep and site prepped and draped Patient monitoring: continuous pulse ox, blood pressure, heart rate and cardiac monitor Approach: midline Location: L3-L4 Injection technique: LOR air  Needle:  Needle type: Tuohy  Needle gauge: 17 G Needle length: 9 cm Needle insertion depth: 6.5 cm Catheter type: closed end flexible Catheter size: 19 Gauge Catheter at skin depth: 12 cm Test dose: negative  Assessment Sensory level: T8 Events: blood not aspirated, no cerebrospinal fluid, injection not painful, no injection resistance, no paresthesia and negative IV test  Additional Notes Patient identified. Risks/Benefits/Options discussed with patient including but not limited to bleeding, infection, nerve damage, paralysis, failed block, incomplete pain control, headache, blood pressure changes, nausea, vomiting, reactions to medication both or allergic, itching and postpartum back pain. Confirmed with bedside nurse the patient's most recent platelet count. Confirmed with patient that they are not currently taking any anticoagulation, have any bleeding history or any family history of bleeding disorders. Patient expressed understanding and wished to proceed. All questions were answered. Sterile technique was used throughout the entire procedure. Please see nursing notes for vital signs. Test dose was given through epidural catheter and negative prior to continuing to dose epidural or start infusion. Warning signs of high block given to the patient including shortness of breath, tingling/numbness in  hands, complete motor block, or any concerning symptoms with instructions to call for help. Patient was given instructions on fall risk and not to get out of bed. All questions and concerns addressed with instructions to call with any issues or inadequate analgesia.  Reason for block:procedure for pain

## 2022-10-10 ENCOUNTER — Encounter (HOSPITAL_COMMUNITY): Payer: Self-pay | Admitting: Obstetrics and Gynecology

## 2022-10-10 DIAGNOSIS — O480 Post-term pregnancy: Secondary | ICD-10-CM

## 2022-10-10 DIAGNOSIS — O99820 Streptococcus B carrier state complicating pregnancy: Secondary | ICD-10-CM

## 2022-10-10 LAB — BIRTH TISSUE RECOVERY COLLECTION (PLACENTA DONATION)

## 2022-10-10 MED ORDER — PRENATAL MULTIVITAMIN CH
1.0000 | ORAL_TABLET | Freq: Every day | ORAL | Status: DC
  Administered 2022-10-10 – 2022-10-11 (×2): 1 via ORAL
  Filled 2022-10-10 (×2): qty 1

## 2022-10-10 MED ORDER — IBUPROFEN 600 MG PO TABS
600.0000 mg | ORAL_TABLET | Freq: Four times a day (QID) | ORAL | Status: DC
  Administered 2022-10-10 – 2022-10-11 (×6): 600 mg via ORAL
  Filled 2022-10-10 (×6): qty 1

## 2022-10-10 MED ORDER — ONDANSETRON HCL 4 MG PO TABS: 4.0000 mg | ORAL_TABLET | ORAL | Status: DC | PRN

## 2022-10-10 MED ORDER — SIMETHICONE 80 MG PO CHEW: 80.0000 mg | CHEWABLE_TABLET | ORAL | Status: DC | PRN

## 2022-10-10 MED ORDER — SENNOSIDES-DOCUSATE SODIUM 8.6-50 MG PO TABS
2.0000 | ORAL_TABLET | ORAL | Status: DC
  Administered 2022-10-10 – 2022-10-11 (×2): 2 via ORAL
  Filled 2022-10-10 (×2): qty 2

## 2022-10-10 MED ORDER — DIBUCAINE (PERIANAL) 1 % EX OINT: 1.0000 | TOPICAL_OINTMENT | CUTANEOUS | Status: DC | PRN

## 2022-10-10 MED ORDER — COCONUT OIL OIL: 1.0000 | TOPICAL_OIL | Status: DC | PRN

## 2022-10-10 MED ORDER — DIPHENHYDRAMINE HCL 25 MG PO CAPS: 25.0000 mg | ORAL_CAPSULE | Freq: Four times a day (QID) | ORAL | Status: DC | PRN

## 2022-10-10 MED ORDER — TETANUS-DIPHTH-ACELL PERTUSSIS 5-2.5-18.5 LF-MCG/0.5 IM SUSY: 0.5000 mL | PREFILLED_SYRINGE | Freq: Once | INTRAMUSCULAR | Status: DC

## 2022-10-10 MED ORDER — ACETAMINOPHEN 325 MG PO TABS: 650.0000 mg | ORAL_TABLET | ORAL | Status: DC | PRN

## 2022-10-10 MED ORDER — ZOLPIDEM TARTRATE 5 MG PO TABS: 5.0000 mg | ORAL_TABLET | Freq: Every evening | ORAL | Status: DC | PRN

## 2022-10-10 MED ORDER — WITCH HAZEL-GLYCERIN EX PADS: 1.0000 | MEDICATED_PAD | CUTANEOUS | Status: DC | PRN

## 2022-10-10 MED ORDER — BENZOCAINE-MENTHOL 20-0.5 % EX AERO: 1.0000 | INHALATION_SPRAY | CUTANEOUS | Status: DC | PRN

## 2022-10-10 MED ORDER — ONDANSETRON HCL 4 MG/2ML IJ SOLN: 4.0000 mg | INTRAMUSCULAR | Status: DC | PRN

## 2022-10-10 NOTE — Progress Notes (Signed)
Stratus used to explain to mom to feed infant formula given. Mom is up ambulating. I told her to let RN know if she has any concerns or don't understand . In house interpreter notified to order food and help patient do [place]. Patient understands plan of care.

## 2022-10-10 NOTE — Discharge Summary (Signed)
     Postpartum Discharge Summary  Date of Service updated***     Patient Name: Robin Shelton DOB: 06-21-1991 MRN: [id number]  Date of admission: 10/08/2022 Delivery date:10/10/2022  Delivering provider: Concepcion Living  Date of discharge: 10/10/2022  Admitting diagnosis: Encounter for induction of labor [Z34.90] Intrauterine pregnancy: [redacted]w[redacted]d    Secondary diagnosis:  Principal Problem:   Encounter for induction of labor Active Problems:   Vacuum-assisted vaginal delivery  Additional problems: ***    Discharge diagnosis: Term Pregnancy Delivered                                              Post partum procedures:{Postpartum procedures:23558} Augmentation: AROM, Pitocin, Cytotec, and IP Foley Complications: None  Hospital course: Induction of Labor With Vaginal Delivery   32y.o. yo G1P0 at 453w1das admitted to the hospital 10/08/2022 for induction of labor.  Indication for induction: Postdates.  Patient had an labor course complicated by vacuum assisted vaginal delivery.  Membrane Rupture Time/Date: 4:28 PM ,10/08/2022   Delivery Method:Vaginal, Vacuum (Extractor)  Episiotomy: None  Lacerations:  1st degree;Perineal  Details of delivery can be found in separate delivery note.  Patient had a postpartum course complicated by***. Patient is discharged home 10/10/22.  Newborn Data: Birth date:10/10/2022  Birth time:1:24 AM  Gender:Female  Living status:Living  Apgars:8 ,9  Weight:   Magnesium Sulfate received: No BMZ received: No Rhophylac:N/A MMR:No T-DaP:{Tdap:23962} Flu: No Transfusion:{Transfusion received:30440034}  Physical exam  Vitals:   10/10/22 0129 10/10/22 0135 10/10/22 0153 10/10/22 0201  BP: (!) 119/44 109/60 114/64 114/64  Pulse: 69 64 62 60  Resp: 20 20 20   $ Temp:   98.7 F (37.1 C)   TempSrc:      SpO2:      Weight:      Height:       General: {Exam; general:21111117} Lochia: {Desc; appropriate/inappropriate:30686::"appropriate"} Uterine  Fundus: {Desc; firm/soft:30687} Incision: {Exam; incision:21111123} DVT Evaluation: {Exam; dvt:2111122} Labs: Lab Results  Component Value Date   WBC 11.4 (H) 10/08/2022   HGB 14.2 10/08/2022   HCT 40.6 10/08/2022   MCV 89.8 10/08/2022   PLT 307 10/08/2022       No data to display         Edinburgh Score:     No data to display           After visit meds:  Allergies as of 10/10/2022   No Known Allergies   Med Rec must be completed prior to using this [facility]        Discharge home in stable condition Infant Feeding: {Baby feeding:23562} Infant Disposition:{CHL IP OB HOME WITH MOOP:[id number] instruction: per After Visit Summary and Postpartum booklet. Activity: Advance as tolerated. Pelvic rest for 6 weeks.  Diet: {OB diTF:3416389uture Appointments:No future appointments. Follow up Visit:   Please schedule this patient for a In person postpartum visit in 6 weeks with the following provider: Any provider. Additional Postpartum F/U: None   Low risk pregnancy complicated by:  None Delivery mode:  Vaginal, Vacuum (Extractor)  Anticipated Birth Control:  Depo   10/10/2022 JoConcepcion LivingMD

## 2022-10-10 NOTE — Anesthesia Postprocedure Evaluation (Signed)
Anesthesia Post Note  Patient: Robin Shelton  Procedure(s) Performed: AN AD HOC LABOR EPIDURAL     Patient location during evaluation: Mother Baby Anesthesia Type: Epidural Level of consciousness: awake and alert Pain management: pain level controlled Vital Signs Assessment: post-procedure vital signs reviewed and stable Respiratory status: spontaneous breathing, nonlabored ventilation and respiratory function stable Cardiovascular status: stable Postop Assessment: no headache, no backache and epidural receding Anesthetic complications: no  No notable events documented.  Last Vitals:  Vitals:   10/10/22 0821 10/10/22 1115  BP: 112/78 96/60  Pulse: 60 68  Resp: 18 18  Temp: 36.7 C 36.7 C  SpO2: 99%     Last Pain:  Vitals:   10/10/22 0520  TempSrc: Oral  PainSc: 0-No pain   Pain Goal:                   Sandrea Matte

## 2022-10-10 NOTE — Progress Notes (Signed)
Stratus used to make sure mom had no complaints and concerns.Mom encouraged to feed infant up to 15cc infant is sleepy.

## 2022-10-10 NOTE — Lactation Note (Signed)
This note was copied from a baby's chart. Lactation Consultation Note  Patient Name: Robin Shelton [id number] Date: 10/10/2022 Age:32 hours Reason for consult: Initial assessment;Term;1st time breastfeeding;Primapara  Visited with family of 75 hours old FT female; Ms. Cavinder is a P1 and reported (+) breast changes during the pregnancy. Offered assistance with latch but she politely declined stating baby just fed. Asked her to call for assistance when needed. Assisted with hand expression (no colostrum noted yet) and assembly of her hand pump, she requested it since she doesn't have one for home use. Reviewed normal newborn behavior, feeding cues, cluster feeding, size of baby' stomach, lactogenesis II, supplementation guidelines when feeding baby at the breast and anticipatory guidelines.   Maternal Data Has patient been taught Hand Expression?: Yes Does the patient have breastfeeding experience prior to this delivery?: No  Feeding Mother's Current Feeding Choice: Breast Milk and Formula  Lactation Tools Discussed/Used Tools: Pump;Flanges Flange Size: 24 Breast pump type: Manual Pump Education: Setup, frequency, and cleaning;Milk Storage Reason for Pumping: patient's request Pumping frequency: whenever baby is getting formula or PRN  Interventions Interventions: Breast feeding basics reviewed;Education;LC Services brochure;Breast massage;Hand express  Plan of care Encouraged to continue putting baby to breast at least 8 times/24 hours or sooner if feeding cues are present Hand expression and spoon feeding were also encouraged Pumping whenever baby is getting a bottle with formula was also strongly encouraged  Parents will continue supplementing baby with Similac 20 calorie formula per feeding choice on admission following supplementation guidelines  FOB and female visitor present. All questions and concerns answered, family to contact [facility] services PRN.  Consult Status Consult  Status: Follow-up Date: 10/11/22 Follow-up type: In-patient   Robin Shelton 10/10/2022, 2:15 PM

## 2022-10-11 ENCOUNTER — Ambulatory Visit: Payer: Self-pay

## 2022-10-11 NOTE — Lactation Note (Signed)
This note was copied from a baby's chart. Lactation Consultation Note  Patient Name: Robin Shelton [id number] Date: 10/11/2022 Age:32 hours Reason for consult: Follow-up assessment;Term;Primapara;1st time breastfeeding  Visited with family of 80 hours old FT female; Ms. Mirkin is a P1 and reports she tried to take baby to breast today but she didn't latch, so she's been supplementing with formula during most of her hospital stay; a LATCH score of 6 was documented at birth but not after that. Her goal is to breastfeed/provide breastmilk for her baby; she had a hand pump to take home. Reviewed discharge education, engorgement prevention/treatment, feeding cues, cluster feeding, lactogenesis II, red flags on when to call baby's doctor and anticipatory guidelines. Encouraged to put baby to breast prior offering formula at least 8 times/24 hours or sooner if feeding cues are present, if baby still has difficulties latching on, she should pump whenever baby gets a bottle of formula to protect her supply. Referral to [facility] OP sent, [facility] will be going [facility] the [facility]. All questions and concerns answered, family to contact [facility] services PRN.  Feeding Mother's Current Feeding Choice: Breast Milk and Formula  Lactation Tools Discussed/Used Tools: Pump;Flanges Flange Size: 24 Breast pump type: Double-Electric Breast Pump Pump Education: Setup, frequency, and cleaning;Milk Storage Reason for Pumping: patient's request Pumping frequency: whenever baby is getting formula or PRN  Interventions Interventions: Breast feeding basics reviewed;Education;Hand pump  Discharge Discharge Education: Engorgement and breast care;Warning signs for feeding baby;Outpatient recommendation;Outpatient Epic message sent Pump: Manual  Consult Status Consult Status: Complete Date: 10/11/22 Follow-up type: Call as needed   Wilene Pharo Francene Boyers 10/11/2022, 1:35 PM

## 2022-10-11 NOTE — Discharge Instructions (Addendum)
-   Contine con sus vitaminas prenatales, especialmente si est amamantando. - Intenta comer alimentos ricos en hierro. - Tome Tylenol (500 mg) o ibuprofeno (200 mg) de venta libre tres veces al da segn sea necesario para los calambres o Conservation officer, historic buildings. - seguimiento en la clnica en 4 a 6 semanas segn lo programado para su visita regular posparto. - Por favor regrese a MAU si nota presin arterial elevada persistentemente o comienza a tener dolor de cabeza, que no mejora con medicamentos (tylenol e ibuprofeno), descanso (4 horas de sueo) y agua potable.    Continue your prenatal vitamins especially if breastfeeding - Try to eat iron rich food. - Take over the counter tylenol (532m) or ibuprofen (2039m three times a day as needed for cramping/pain.. - follow up in clinic in 4-6 weeks as scheduled for your regular post partum visit. - Please come back to MAU if you notice persistently elevated blood pressures or you start to have a headache, that doesn't get better with medications (tylenol and ibuprofen), rest (4hrs of sleep) and drinking water.

## 2022-10-11 NOTE — Progress Notes (Signed)
POSTPARTUM PROGRESS NOTE  Post Partum Day 1  Subjective:  Robin Shelton is a 32 y.o. G1P1001 s/p VAVD at [redacted]w[redacted]d  She reports she is doing well. No acute events overnight. She denies any problems with ambulating, voiding or po intake. Denies nausea or vomiting.  Pain is well controlled.  Lochia is adequate.  Objective: Blood pressure 96/66, pulse (!) 57, temperature 97.7 F (36.5 C), temperature source Oral, resp. rate 16, height '4\' 11"'$  (1.499 m), weight 67.7 kg, last menstrual period 11/25/2021, SpO2 98 %, unknown if currently breastfeeding.  Physical Exam:  General: alert, cooperative and no distress Chest: no respiratory distress Heart:regular rate, distal pulses intact Uterine Fundus: firm, appropriately tender DVT Evaluation: No calf swelling or tenderness Extremities: no edema Skin: warm, dry  CBC    Component Value Date/Time   WBC 11.4 (H) 10/08/2022 0025   RBC 4.52 10/08/2022 0025   HGB 14.2 10/08/2022 0025   HCT 40.6 10/08/2022 0025   PLT 307 10/08/2022 0025   MCV 89.8 10/08/2022 0025   MCH 31.4 10/08/2022 0025   MCHC 35.0 10/08/2022 0025   RDW 12.9 10/08/2022 0025   LYMPHSABS 3.4 11/27/2021 1257   MONOABS 0.6 11/27/2021 1257   EOSABS 0.1 11/27/2021 1257   BASOSABS 0.1 11/27/2021 1257     Assessment/Plan: Robin Kanoffis a 32y.o. G1P1001 s/p VAVD at 465w1d PPD#1 - Doing well  Routine postpartum care Contraception: depo Feeding: breast and bottle Dispo: Plan for discharge today v tomorrow depending on baby.   LOS: 3 days   JeShelda PalDO OB Fellow  10/11/2022, 8:08 AM

## 2022-10-14 ENCOUNTER — Other Ambulatory Visit: Payer: Self-pay

## 2022-10-14 ENCOUNTER — Inpatient Hospital Stay (HOSPITAL_COMMUNITY)
Admission: AD | Admit: 2022-10-14 | Discharge: 2022-10-14 | Disposition: A | Discharge status: Home / Self Care | Payer: Self-pay | Attending: Obstetrics & Gynecology | Admitting: Obstetrics & Gynecology

## 2022-10-14 DIAGNOSIS — Z7952 Long term (current) use of systemic steroids: Secondary | ICD-10-CM | POA: Insufficient documentation

## 2022-10-14 DIAGNOSIS — L509 Urticaria, unspecified: Secondary | ICD-10-CM | POA: Insufficient documentation

## 2022-10-14 DIAGNOSIS — O909 Complication of the puerperium, unspecified: Secondary | ICD-10-CM | POA: Insufficient documentation

## 2022-10-14 MED ORDER — PREDNISONE 50 MG PO TABS
50.0000 mg | ORAL_TABLET | Freq: Once | ORAL | Status: AC
  Administered 2022-10-14: 50 mg via ORAL
  Filled 2022-10-14: qty 1

## 2022-10-14 MED ORDER — PREDNISONE 20 MG PO TABS: 40.0000 mg | ORAL_TABLET | Freq: Every day | ORAL | 0 refills | Status: AC

## 2022-10-14 MED ORDER — DIPHENHYDRAMINE HCL 50 MG/ML IJ SOLN: 50.0000 mg | Freq: Once | INTRAMUSCULAR | Status: DC

## 2022-10-14 MED ORDER — LORATADINE 10 MG PO TABS: 10.0000 mg | ORAL_TABLET | Freq: Every day | ORAL | 0 refills | Status: AC

## 2022-10-14 MED ORDER — DIPHENHYDRAMINE HCL 25 MG PO CAPS
50.0000 mg | ORAL_CAPSULE | Freq: Once | ORAL | Status: AC
  Administered 2022-10-14: 50 mg via ORAL
  Filled 2022-10-14: qty 2

## 2022-10-14 NOTE — MAU Provider Note (Signed)
History     CSN: [id number]  Arrival date and time: 10/14/22 0944   Event Date/Time   First Provider Initiated Contact with Patient 10/14/22 1022      Chief Complaint  Patient presents with   Robin Shelton is a 32 y.o. G1P1001 who is 4 days PP from a NSVD. She is here today with a rash. She states that the rash started on 10/11/2022 after she left here. It has continued to get worse and spread to new areas. It started on her back near the ;eft axilla and then has spread to the right axilla, the right side of her abdomen and now onto her left upper thigh. She denies any new foods or changes in her diet. She denies any new medications. She has no pets or new outdoor exposures. She lives in apartment and her and her husband have not left the house since coming home with the baby. She is breastfeeding. She denies any fever, cough or congestion.   Rash This is a new problem. The current episode started in the past 7 days. The problem has been gradually worsening since onset. The affected locations include the abdomen, left axilla, right axilla and left upper leg. The rash is characterized by itchiness, redness and scaling. She was exposed to nothing. Pertinent negatives include no congestion, cough, facial edema, fever, joint pain or shortness of breath. Past treatments include nothing.    OB History     Gravida  1   Para  1   Term  1   Preterm      AB      Living  1      SAB      IAB      Ectopic      Multiple  0   Live Births  1           Past Medical History:  Diagnosis Date   Medical history non-contributory     Past Surgical History:  Procedure Laterality Date   NO PAST SURGERIES      No family history on file.  Social History   Tobacco Use   Smoking status: Never   Smokeless tobacco: Never  Vaping Use   Vaping Use: Never used  Substance Use Topics   Alcohol use: Not Currently   Drug use: Not Currently    Allergies: No Known  Allergies  Medications Prior to Admission  Medication Sig Dispense Refill Last Dose   Prenatal Vit-Fe Fumarate-FA (PRENATAL PO) Take 1 tablet by mouth daily.   10/13/2022    Review of Systems  Constitutional:  Negative for fever.  HENT:  Negative for congestion.   Respiratory:  Negative for cough and shortness of breath.   Musculoskeletal:  Negative for joint pain.  Skin:  Positive for rash.  All other systems reviewed and are negative.  Physical Exam   Blood pressure 133/78, pulse 75, temperature 98.5 F (36.9 C), temperature source Oral, resp. rate 16, weight 62 kg, last menstrual period 11/25/2021, SpO2 100 %, unknown if currently breastfeeding.  Physical Exam Vitals and nursing note reviewed. Exam conducted with a chaperone present.  Constitutional:      General: She is not in acute distress. HENT:     Head: Normocephalic.  Eyes:     Pupils: Pupils are equal, round, and reactive to light.  Cardiovascular:     Rate and Rhythm: Normal rate.  Pulmonary:     Effort: Pulmonary effort is normal.  Abdominal:  Palpations: Abdomen is soft.     Tenderness: There is no abdominal tenderness.  Musculoskeletal:        General: Normal range of motion.  Skin:    General: Skin is warm and dry.     Findings: Rash present. Rash is urticarial.       Neurological:     Mental Status: She is alert and oriented to person, place, and time.  Psychiatric:        Mood and Affect: Mood normal.        Behavior: Behavior normal.     MAU Course  Procedures  MDM Uncertain as to possible exposure. I could have been something here, possibly the hospital detergent. Will give patient antihistamines and short course of steroids to help with symptoms. Patient advised if she develops rash on her face/mouth or has any trouble bleeding to return to the ED right away. Otherwise we could consider allergist referral from the office if needed.   Assessment and Plan   1. Urticaria   2. Postpartum  state    DC home in stable condition  Comfort measures reviewed  RX: prednisone '40mg'$  q day x 4 days (has had 1 dose here), Claratin '10mg'$  daily #30  Return to MAU as needed FU with OB as planned   Follow-up Information     Department, [facility] Follow up.   Why: As scheduled Contact information: [place] [address] [phone]                Talon Witting DNP, CNM  10/14/22  11:12 AM

## 2022-10-14 NOTE — MAU Note (Signed)
Robin Shelton is a 32 y.o. here in MAU reporting: s/p VAVD on 2/28. When she left the hospital she started having a lot of itching on her back. Now it is also on her abdomen and down her leg. Has not tried benadryl. No new soaps or detergents. Has a rash in all of the places that itch.   Onset of complaint: ongoing  Pain score: 0/10  Vitals:   10/14/22 1001  BP: 118/72  Pulse: 81  Resp: 16  Temp: 98.5 F (36.9 C)  SpO2: 98%     Lab orders placed from triage: none

## 2022-10-22 ENCOUNTER — Telehealth (HOSPITAL_COMMUNITY): Payer: Self-pay | Admitting: *Deleted

## 2022-10-22 NOTE — Telephone Encounter (Signed)
Interpreter not available at present.  Odis Hollingshead, RN 10-22-2022 at 11:29am

## 2022-10-23 ENCOUNTER — Telehealth (HOSPITAL_COMMUNITY): Payer: Self-pay | Admitting: *Deleted

## 2022-10-23 NOTE — Telephone Encounter (Signed)
Mom reports feeling good. No concerns about herself at this time. EPDS not completed, but feeling good emotionally. [facility] score=0) Mom reports baby is doing well. Feeding, peeing, and pooping without difficulty. Safe sleep reviewed. Mom reports no concerns about baby at present.  Odis Hollingshead, RN 10-23-2022 at 10:45am

## 2022-11-10 ENCOUNTER — Telehealth (HOSPITAL_COMMUNITY): Payer: Self-pay

## 2022-11-10 NOTE — Telephone Encounter (Signed)
Preadmission testing 

## 2024-02-05 IMAGING — CT CT RENAL STONE PROTOCOL
2 of 4 series · 17 of 46 positions shown, 19 images · non-contrast
Comparison: None.

CLINICAL DATA: Back pain, dysuria, urinary frequency



[Series 3: stone study 5.0 i30f 2 · axial · 0.91mm/px · z∈[+555,+950]mm · 14 of 87 slices shown, 16 images]
[im 4/87  soft-tissue]
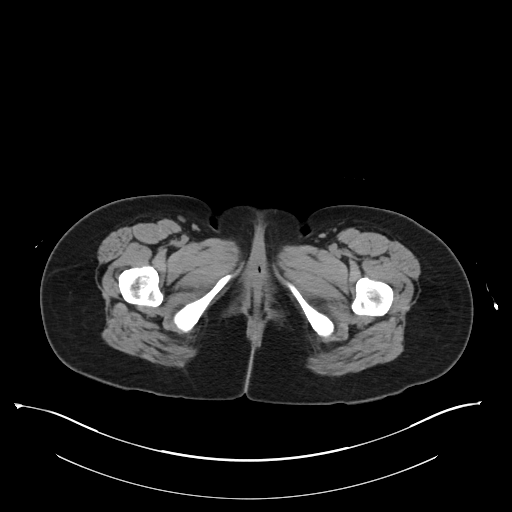
[im 4/87  bone]
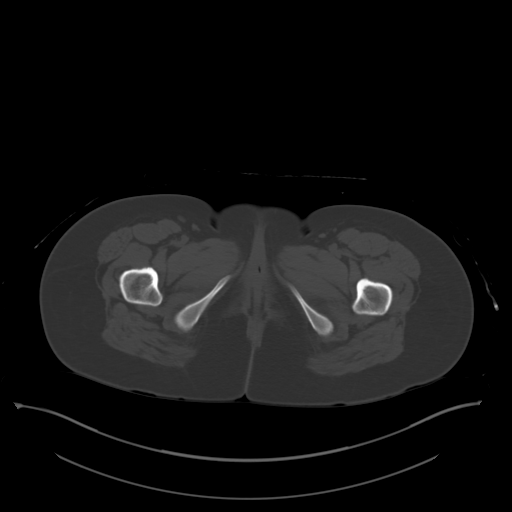
[im 10/87  soft-tissue]
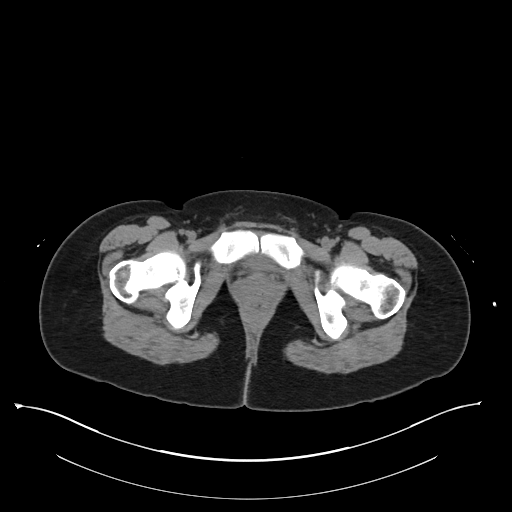
[im 17/87  soft-tissue]
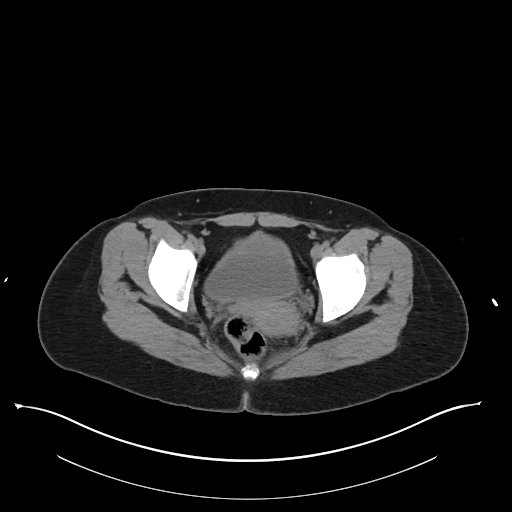
[im 24/87  soft-tissue]
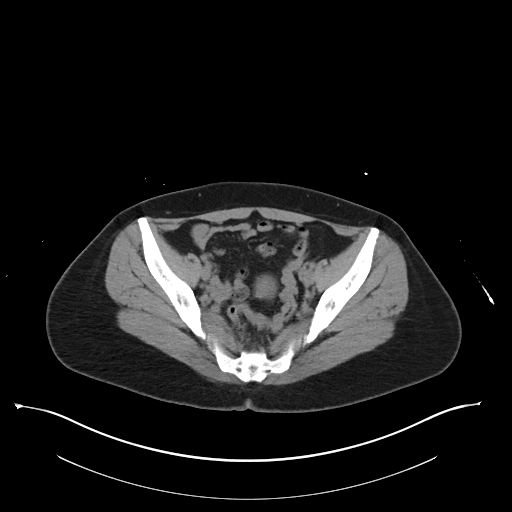
[im 30/87  soft-tissue]
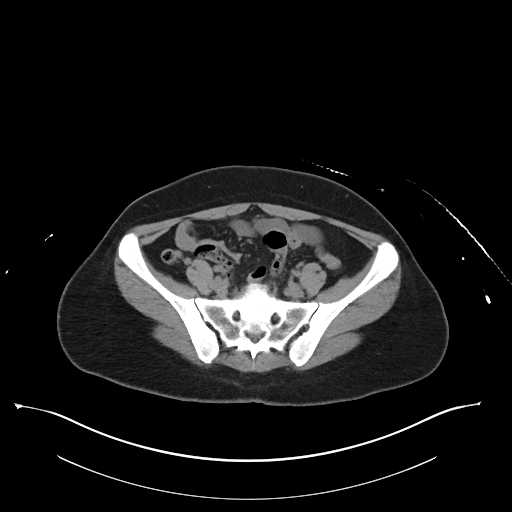
[im 34/87  soft-tissue]
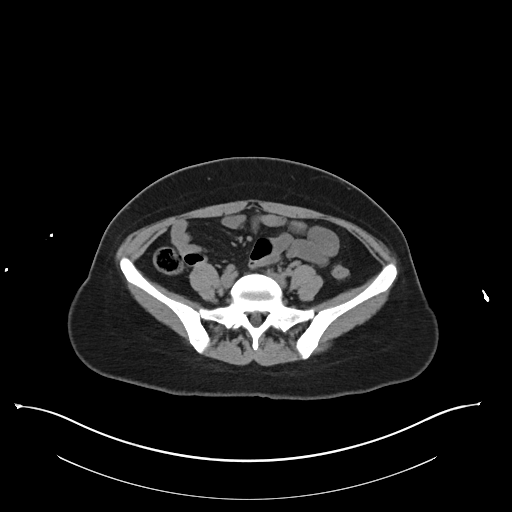
[im 40/87  soft-tissue]
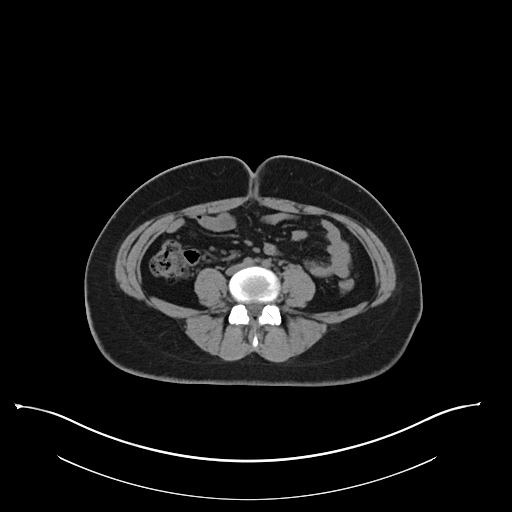
[im 47/87  soft-tissue]
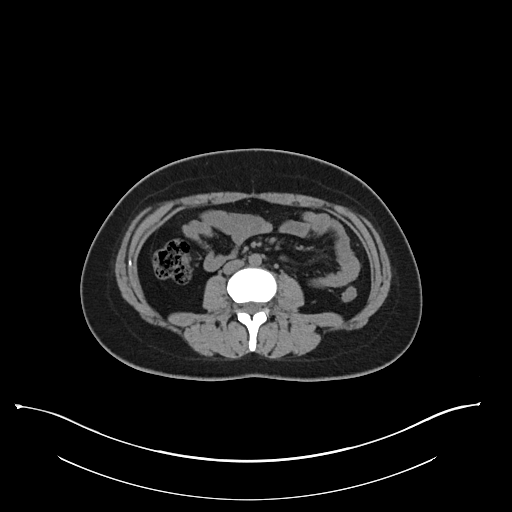
[im 53/87  soft-tissue]
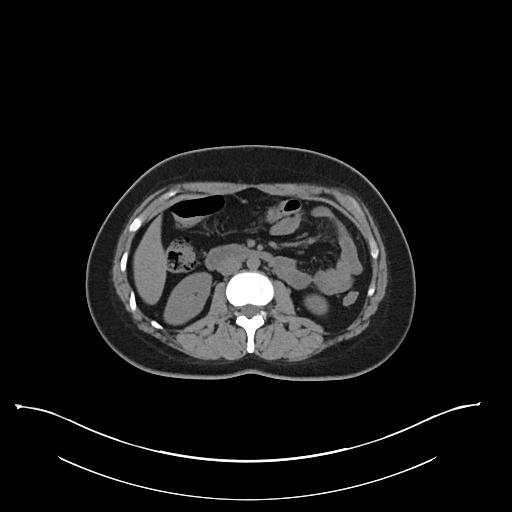
[im 53/87  bone]
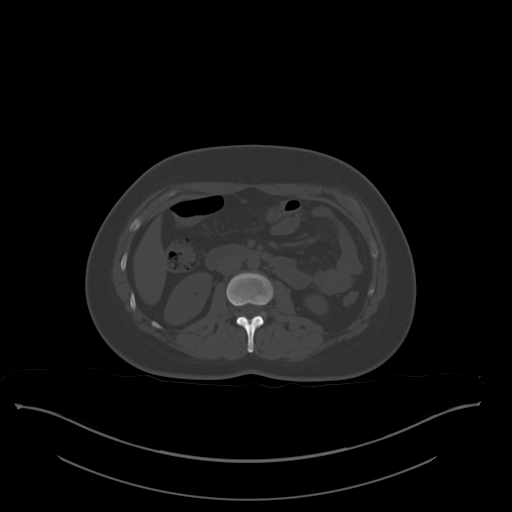
[im 57/87  soft-tissue]
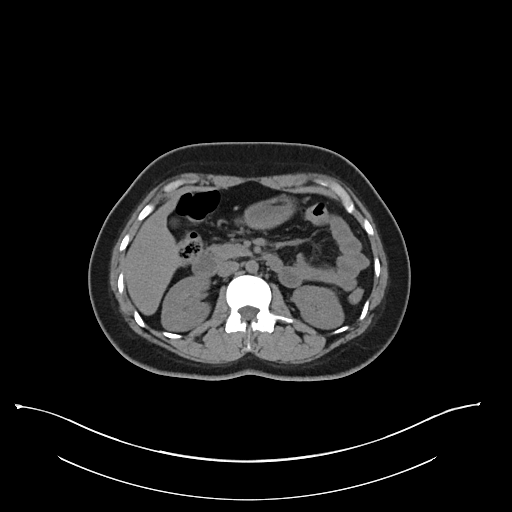
[im 63/87  soft-tissue]
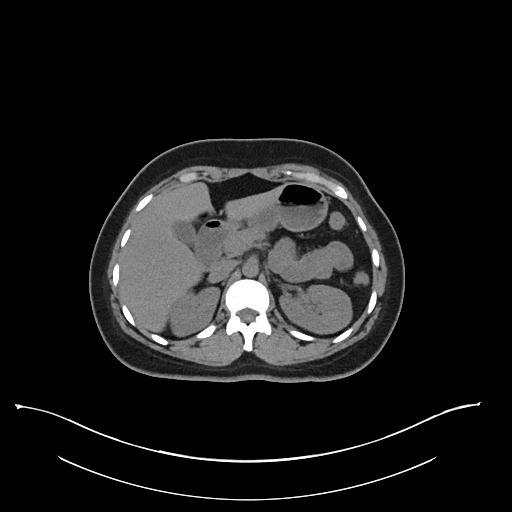
[im 70/87  soft-tissue]
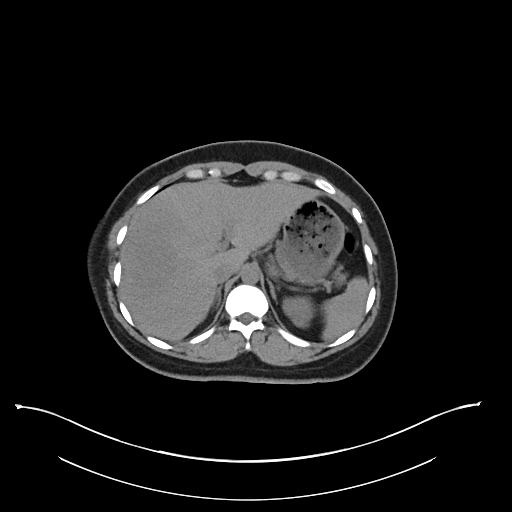
[im 77/87  soft-tissue]
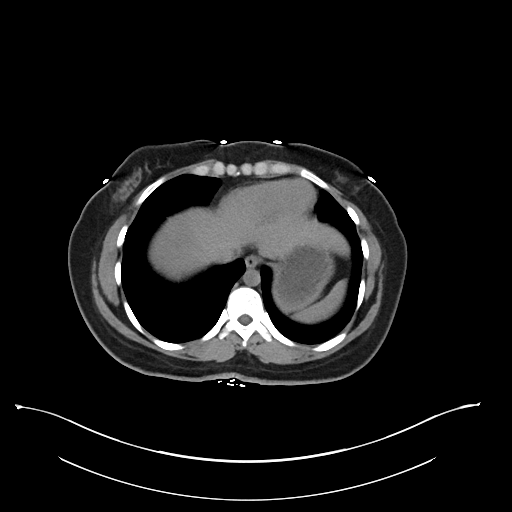
[im 83/87  soft-tissue]
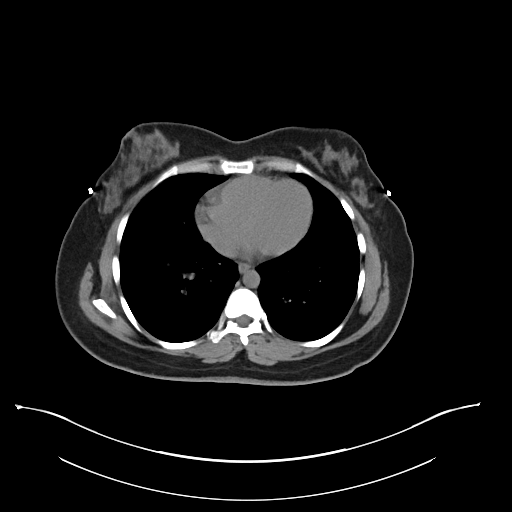

[Series 6: coronal soft tissue · coronal · 0.81mm/px · 3 of 93 slices shown]
[im 31/93  soft-tissue]
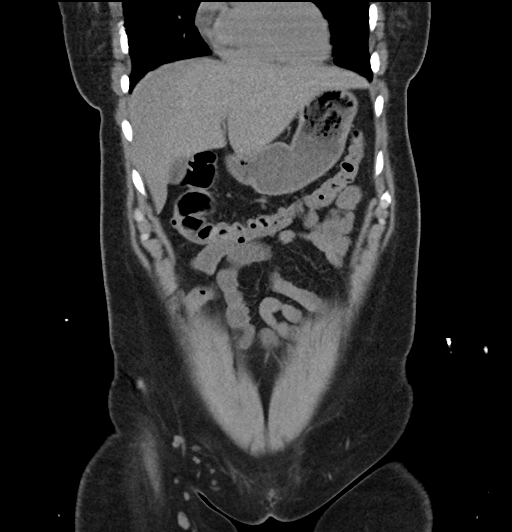
[im 41/93  soft-tissue]
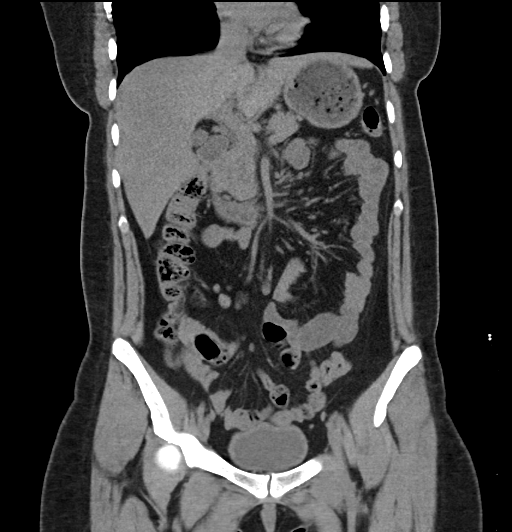
[im 52/93  soft-tissue]
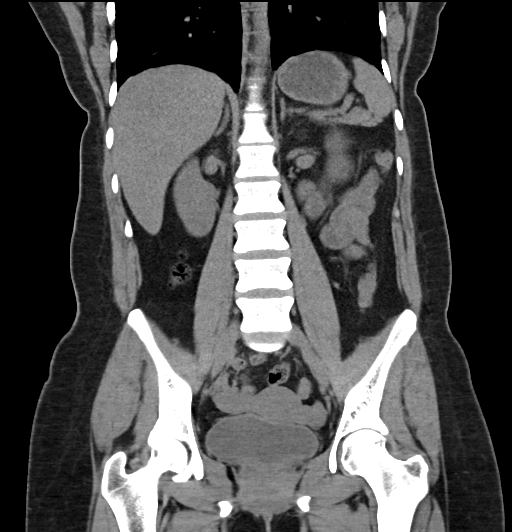

[17 of 46 positions shown; findings below may reference images not displayed]

FINDINGS: Lower chest: No acute abnormality.

Hepatobiliary: No focal liver abnormality is seen. No gallstones,
gallbladder wall thickening, or biliary dilatation.

Pancreas: Unremarkable. No pancreatic ductal dilatation or
surrounding inflammatory changes.

Spleen: Normal in size without focal abnormality.

Adrenals/Urinary Tract: The adrenal glands are unremarkable. Normal
noncontrast appearance of the bilateral kidneys, without
hydronephrosis or nephrolithiasis. No stone is seen in the course of
the ureters, although the inferior ureters are poorly evaluated. No
stone is seen in the bladder, which is unremarkable for degree of
distension.

Stomach/Bowel: Stomach is within normal limits. Appendix appears
normal. No evidence of bowel wall thickening, distention, or
inflammatory changes.

Vascular/Lymphatic: No significant vascular findings are present. No
enlarged abdominal or pelvic lymph nodes.

Reproductive: Uterus and bilateral adnexa are unremarkable.

Other: No abdominal wall hernia or abnormality. No abdominopelvic
ascites.

Musculoskeletal: No acute or significant osseous findings.
IMPRESSION: 1. Normal noncontrast appearance of the bilateral kidneys, without
radiopaque calculus seen in the kidneys, ureters, or bladder.
2. No acute process in the abdomen or pelvis.
